# Patient Record
Sex: Male | Born: 2000 | Race: Black or African American | Hispanic: No | Marital: Single | Smoking: Never smoker
Health system: Southern US, Community
[De-identification: ages and names within clinical notes are randomized; demographics above are authoritative.]

## PROBLEM LIST (undated history)

## (undated) ENCOUNTER — Emergency Department (HOSPITAL_COMMUNITY): Admission: EM | Payer: Self-pay | Source: Home / Self Care

---
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
## 2022-01-30 IMAGING — DX DG WRIST COMPLETE 3+V*L*
4 series · 4 of 4 positions shown · non-contrast
Comparison: 12/28/2017

CLINICAL DATA: Fall while playing basketball with wrist pain,
initial encounter

EXAM:
LEFT WRIST - COMPLETE 3+ VIEW

[wrist pa (1 of 3)]
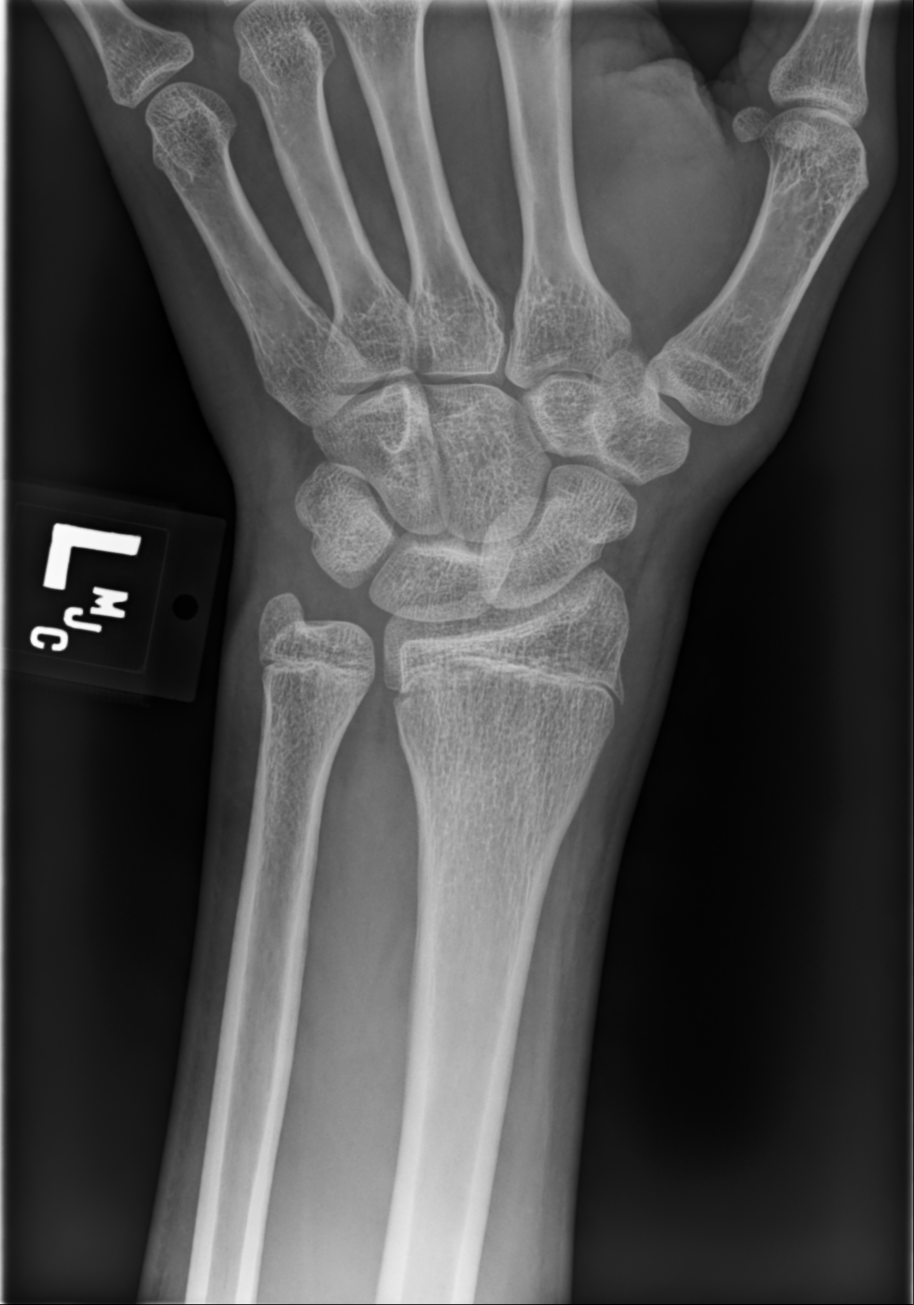

[wrist pa (2 of 3)]
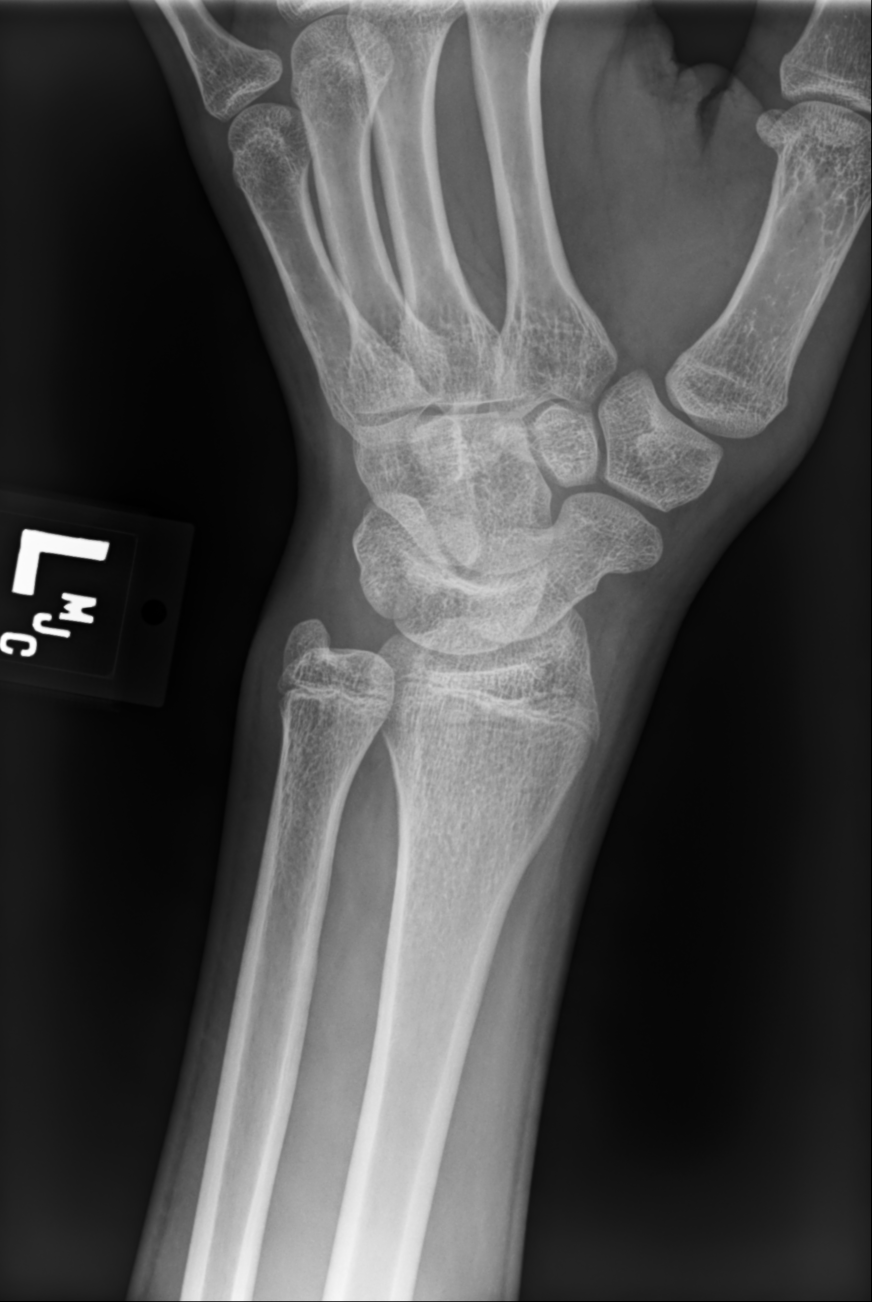

[wrist pa (3 of 3)]
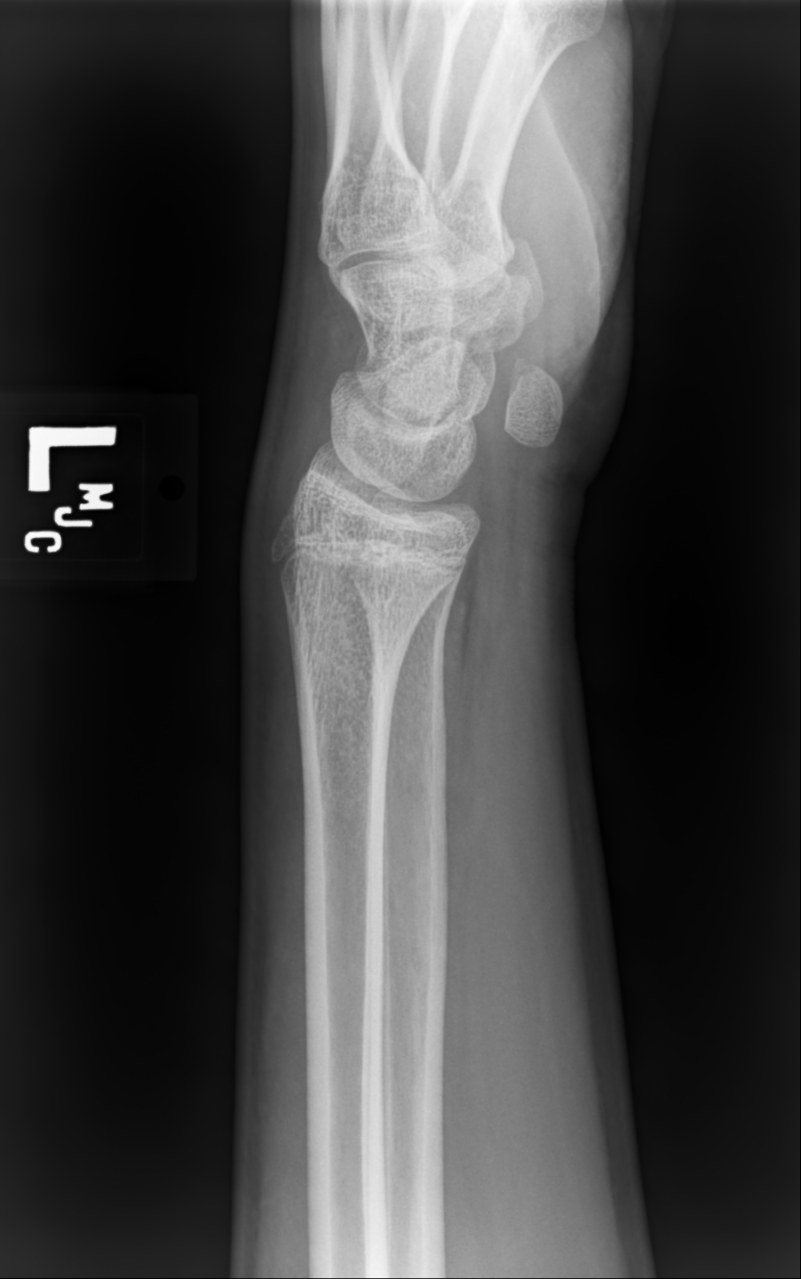

[wrist lat]
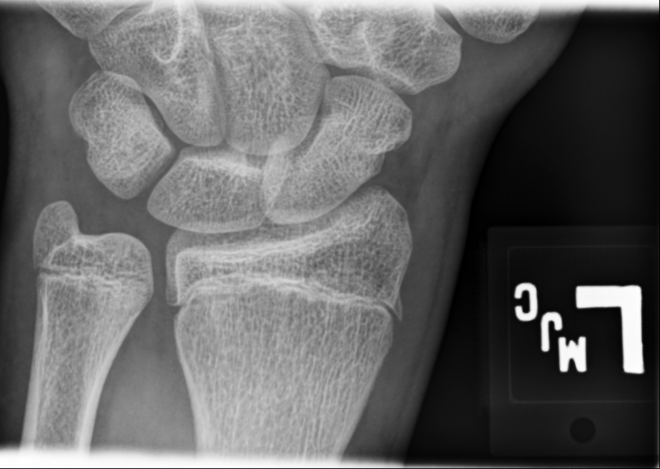

[4 of 4 positions shown; findings below may reference images not displayed]

FINDINGS: Previously seen distal radial fracture has healed in the interval
from the prior exam. No acute fracture or dislocation is noted. No
soft tissue changes are seen.
IMPRESSION: No acute abnormality noted.

## 2022-07-10 ENCOUNTER — Emergency Department (HOSPITAL_COMMUNITY)
Admission: EM | Admit: 2022-07-10 | Discharge: 2022-07-10 | Disposition: A | Discharge status: Home / Self Care | Payer: Medicaid Other | Attending: Emergency Medicine | Admitting: Emergency Medicine

## 2022-07-10 ENCOUNTER — Encounter (HOSPITAL_COMMUNITY): Payer: Self-pay | Admitting: Emergency Medicine

## 2022-07-10 DIAGNOSIS — A64 Unspecified sexually transmitted disease: Secondary | ICD-10-CM | POA: Diagnosis not present

## 2022-07-10 DIAGNOSIS — R369 Urethral discharge, unspecified: Secondary | ICD-10-CM

## 2022-07-10 DIAGNOSIS — R360 Urethral discharge without blood: Secondary | ICD-10-CM | POA: Diagnosis present

## 2022-07-10 DIAGNOSIS — Z711 Person with feared health complaint in whom no diagnosis is made: Secondary | ICD-10-CM

## 2022-07-10 LAB — URINALYSIS, ROUTINE W REFLEX MICROSCOPIC
Bacteria, UA: NONE SEEN
Bilirubin Urine: NEGATIVE
Glucose, UA: NEGATIVE mg/dL
Hgb urine dipstick: NEGATIVE
Ketones, ur: 5 mg/dL — AB
Nitrite: NEGATIVE
Protein, ur: 100 mg/dL — AB
Specific Gravity, Urine: 1.031 — ABNORMAL HIGH (ref 1.005–1.030)
WBC, UA: >50 WBC/hpf (ref 0–5)
pH: 7 (ref 5.0–8.0)

## 2022-07-10 MED ORDER — LIDOCAINE HCL (PF) 1 % IJ SOLN
1.0000 mL | Freq: Once | INTRAMUSCULAR | Status: AC
  Administered 2022-07-10: 2.1 mL
  Filled 2022-07-10: qty 30

## 2022-07-10 MED ORDER — DOXYCYCLINE HYCLATE 100 MG PO TABS: 100.0000 mg | ORAL_TABLET | Freq: Two times a day (BID) | ORAL | 0 refills | Status: AC

## 2022-07-10 MED ORDER — CEFTRIAXONE SODIUM 1 G IJ SOLR
500.0000 mg | Freq: Once | INTRAMUSCULAR | Status: AC
  Administered 2022-07-10: 500 mg via INTRAMUSCULAR
  Filled 2022-07-10: qty 10

## 2022-07-10 NOTE — Discharge Instructions (Addendum)
It was a pleasure taking care of you today!  You have pending lab results for STI work-up.  You may see the results of your labs on MyChart. You were treated today for gonorrhea and chlamydia due to your symptoms and exposure. You will be sent a prescription for Doxycycline, ensure to complete the entire course of antibiotics prescribed. You may follow up with your primary care provider or Health Department if you are experiencing continued symptoms.  If you have future STI concerns you may follow-up with the regional [facility] [phone]).  This location provides testing and treatment for STI and prep services.  This is free for individuals without insurance.  Appointments are required and typically you can get in on the same day.  Ensure to practice safe sex with condom use. Return to the Emergency Department if you are experiencing increasing/worsening symptoms.

## 2022-07-10 NOTE — ED Provider Notes (Signed)
[facility] Provider Note   CSN: [id number] Arrival date & time: 07/10/22  1005     History  Chief Complaint  Patient presents with   Penile Discharge    Aaron Love is a 22 y.o. male who presents emergency department with concerns for yellow/white penile discharge.  Notes recent unprotected intercourse.  Denies his partner stating that they were exposed to STDs.  Patient requesting to be checked for chlamydia at this time.  No meds tried prior to arrival.  Denies abdominal pain, nausea, vomiting, urinary symptoms, penile/testicular pain, or penile swelling/testicular swelling.  The history is provided by the patient. No language interpreter was used.       Home Medications Prior to Admission medications   Medication Sig Start Date End Date Taking? Authorizing Provider  doxycycline (VIBRA-TABS) 100 MG tablet Take 1 tablet (100 mg total) by mouth 2 (two) times daily for 7 days. 07/10/22 07/17/22 Yes Serenah Mill A, PA-C  diphenhydrAMINE (BENADRYL) 25 mg capsule Take 1 capsule (25 mg total) by mouth every 6 (six) hours as needed. Patient not taking: Reported on 12/28/2017 08/26/14   Britt Bottom, NP  permethrin (ELIMITE) 5 % cream After warm bath, apply from neck to bottom of feet. Leave on for into 12 hours and then take another warm soapy water bath to wash it off. Patient not taking: Reported on 12/28/2017 08/26/14   Britt Bottom, NP      Allergies    Patient has no known allergies.    Review of Systems   Review of Systems  Genitourinary:  Positive for penile discharge.  All other systems reviewed and are negative.   Physical Exam Updated Vital Signs BP 121/82 (BP Location: Left Arm)   Pulse 78   Temp 97.9 F (36.6 C) (Oral)   Resp 17   SpO2 99%  Physical Exam Vitals and nursing note reviewed. Exam conducted with a chaperone present.  Constitutional:      General: He is not in acute distress.    Appearance:  Normal appearance. He is not ill-appearing.  HENT:     Head: Normocephalic and atraumatic.     Right Ear: External ear normal.     Left Ear: External ear normal.  Eyes:     General: No scleral icterus. Cardiovascular:     Rate and Rhythm: Normal rate and regular rhythm.     Pulses: Normal pulses.     Heart sounds: Normal heart sounds.  Pulmonary:     Effort: Pulmonary effort is normal.     Breath sounds: Normal breath sounds.  Abdominal:     General: Abdomen is flat. Bowel sounds are normal. There is no distension.     Palpations: Abdomen is soft. There is no mass.     Tenderness: There is no abdominal tenderness.     Hernia: There is no hernia in the left inguinal area or right inguinal area.  Genitourinary:    Penis: Uncircumcised. Discharge present. No erythema, tenderness, swelling or lesions.      Testes: Normal. Cremasteric reflex is present.        Right: Mass, tenderness or swelling not present. Right testis is descended.        Left: Mass, tenderness or swelling not present. Left testis is descended.     Epididymis:     Right: Normal.     Left: Normal.     Comments: NT chaperone present for exam. Uncircumcised penis with yellow penile discharge  noted on exam. No inguinal LAD appreciated. No TTP or swelling noted to penis or testicles.  Musculoskeletal:        General: Normal range of motion.     Cervical back: Normal range of motion and neck supple.  Lymphadenopathy:     Lower Body: No right inguinal adenopathy. No left inguinal adenopathy.  Skin:    General: Skin is warm and dry.     Findings: No rash.  Neurological:     Mental Status: He is alert.     ED Results / Procedures / Treatments   Labs (all labs ordered are listed, but only abnormal results are displayed) Labs Reviewed  URINALYSIS, ROUTINE W REFLEX MICROSCOPIC - Abnormal; Notable for the following components:      Result Value   APPearance HAZY (*)    Specific Gravity, Urine 1.031 (*)    Ketones,  ur 5 (*)    Protein, ur 100 (*)    Leukocytes,Ua LARGE (*)    All other components within normal limits  GC/CHLAMYDIA PROBE AMP ([place]) NOT AT [facility]    EKG None  Radiology No results found.  Procedures Procedures    Medications Ordered in ED Medications  cefTRIAXone (ROCEPHIN) injection 500 mg (has no administration in time range)  lidocaine (PF) (XYLOCAINE) 1 % injection 1-2.1 mL (has no administration in time range)    ED Course/ Medical Decision Making/ A&P                              Medical Decision Making Amount and/or Complexity of Data Reviewed Labs: ordered.  Risk Prescription drug management.   Patient presents to the ED with yellow penile discharge. Recent unprotected intercourse. On exam patient with NT chaperone present for exam. Uncircumcised penis with yellow penile discharge noted on exam. No inguinal LAD appreciated. No TTP or swelling noted to penis or testicles. Otherwise no acute cardiovascular, respiratory, or abdominal exam findings.  Differential diagnosis includes gonorrhea, chlamydia, epididymitis, urethritis.    Labs:  I ordered, and personally interpreted labs.  The pertinent results include:   Urinalysis notable for large amount of leukocytes GC/chlamydia ordered and results pending at time of discharge.  Medications:  I ordered medication including Rocephin for STD prophylaxis  I have reviewed the patients home medicines and have made adjustments as needed  Disposition: Presentation suspicious for penile discharge likely in the setting of recent STD exposure. After consideration of the diagnostic results and the patients response to treatment, I feel that the patient would benefit from Discharge home.  We will send a prescription for doxycycline due to symptoms and exposure.  Patient advised to inform and treat all sexual partners.  Pt advised on safe sex practices and understands that they have GC/Chlamydia cultures pending and will  result in 2-3 days. Patient did not want additional testing at this time. Pt encouraged to follow up at local health department for future STI checks. Supportive care measures and strict return precautions discussed with patient at bedside. Pt acknowledges and verbalizes understanding. Pt appears safe for discharge. Follow up as indicated in discharge paperwork.    This chart was dictated using voice recognition software, Dragon. Despite the best efforts of this provider to proofread and correct errors, errors may still occur which can change documentation meaning.  Final Clinical Impression(s) / ED Diagnoses Final diagnoses:  Penile discharge  Concern about STD in male without diagnosis    Rx /  DC Orders ED Discharge Orders          Ordered    doxycycline (VIBRA-TABS) 100 MG tablet  2 times daily        07/10/22 1124              Mareo Portilla A, PA-C 07/10/22 1125    Fredia Sorrow, MD 07/13/22 1410

## 2022-07-10 NOTE — ED Triage Notes (Signed)
Patient c/o yellow/ white penile discharge. Requesting to be checked for chlamydia.

## 2022-07-11 LAB — GC/CHLAMYDIA PROBE AMP (~~LOC~~) NOT AT ARMC
Chlamydia: POSITIVE — AB
Comment: NEGATIVE
Comment: NORMAL
Neisseria Gonorrhea: POSITIVE — AB

## 2023-05-22 ENCOUNTER — Emergency Department (HOSPITAL_COMMUNITY)
Admission: EM | Admit: 2023-05-22 | Discharge: 2023-05-22 | Disposition: A | Discharge status: Home / Self Care | Payer: Medicaid Other | Attending: Emergency Medicine | Admitting: Emergency Medicine

## 2023-05-22 ENCOUNTER — Telehealth (HOSPITAL_COMMUNITY): Payer: Self-pay | Admitting: Emergency Medicine

## 2023-05-22 ENCOUNTER — Encounter (HOSPITAL_COMMUNITY): Payer: Self-pay | Admitting: Emergency Medicine

## 2023-05-22 ENCOUNTER — Other Ambulatory Visit: Payer: Self-pay

## 2023-05-22 DIAGNOSIS — L988 Other specified disorders of the skin and subcutaneous tissue: Secondary | ICD-10-CM | POA: Insufficient documentation

## 2023-05-22 DIAGNOSIS — Z113 Encounter for screening for infections with a predominantly sexual mode of transmission: Secondary | ICD-10-CM | POA: Insufficient documentation

## 2023-05-22 DIAGNOSIS — R238 Other skin changes: Secondary | ICD-10-CM

## 2023-05-22 DIAGNOSIS — A6001 Herpesviral infection of penis: Secondary | ICD-10-CM

## 2023-05-22 LAB — HIV ANTIBODY (ROUTINE TESTING W REFLEX): HIV Screen 4th Generation wRfx: NONREACTIVE

## 2023-05-22 MED ORDER — VALACYCLOVIR HCL 1 G PO TABS: 1000.0000 mg | ORAL_TABLET | Freq: Three times a day (TID) | ORAL | 0 refills | Status: DC

## 2023-05-22 NOTE — ED Provider Notes (Signed)
[facility] EMERGENCY DEPARTMENT AT [facility] Provider Note   CSN: [id number] Arrival date & time: 05/22/23  7829     History  Chief Complaint  Patient presents with   SEXUALLY TRANSMITTED DISEASE    Aaron Love is a 22 y.o. male.  HPI   22 year old male presenting to the emergency department for STI screening.  The patient only symptom is a few vesicles that have appeared on the glans of his penis.  He is currently sexually active and consents to STI testing.  He denies any dysuria, testicular pain or swelling, penile discharge.  Home Medications Prior to Admission medications   Medication Sig Start Date End Date Taking? Authorizing Provider  valACYclovir (VALTREX) 1000 MG tablet Take 1 tablet (1,000 mg total) by mouth 3 (three) times daily. 05/22/23   Ernie Avena, MD  diphenhydrAMINE (BENADRYL) 25 mg capsule Take 1 capsule (25 mg total) by mouth every 6 (six) hours as needed. Patient not taking: Reported on 12/28/2017 08/26/14   Harle Battiest, NP  permethrin (ELIMITE) 5 % cream After warm bath, apply from neck to bottom of feet. Leave on for into 12 hours and then take another warm soapy water bath to wash it off. Patient not taking: Reported on 12/28/2017 08/26/14   Harle Battiest, NP      Allergies    Patient has no known allergies.    Review of Systems   Review of Systems  All other systems reviewed and are negative.   Physical Exam Updated Vital Signs BP (!) 141/86 (BP Location: Left Arm)   Pulse 79   Temp 99.1 F (37.3 C) (Oral)   Resp 16   Ht 6\' 2"  (1.88 m)   Wt 65.8 kg   SpO2 99%   BMI 18.62 kg/m  Physical Exam Vitals and nursing note reviewed. Exam conducted with a chaperone present.  Constitutional:      General: He is not in acute distress. HENT:     Head: Normocephalic and atraumatic.  Eyes:     Conjunctiva/sclera: Conjunctivae normal.     Pupils: Pupils are equal, round, and reactive to light.  Cardiovascular:     Rate  and Rhythm: Normal rate and regular rhythm.  Pulmonary:     Effort: Pulmonary effort is normal. No respiratory distress.  Abdominal:     General: There is no distension.     Tenderness: There is no guarding.  Genitourinary:    Comments: Vesicular lesions on the right of the glans of the penis concerning for possible genital herpes Musculoskeletal:        General: No deformity or signs of injury.     Cervical back: Neck supple.  Skin:    Findings: No lesion or rash.  Neurological:     General: No focal deficit present.     Mental Status: He is alert. Mental status is at baseline.     ED Results / Procedures / Treatments   Labs (all labs ordered are listed, but only abnormal results are displayed) Labs Reviewed  HIV ANTIBODY (ROUTINE TESTING W REFLEX)  RPR  HSV 1 ANTIBODY, IGG  HSV 2 ANTIBODY, IGG  HSV 1/2 PCR (SURFACE)  GC/CHLAMYDIA PROBE AMP ([place]) NOT AT [facility]    EKG None  Radiology No results found.  Procedures Procedures    Medications Ordered in ED Medications - No data to display  ED Course/ Medical Decision Making/ A&P  Medical Decision Making Amount and/or Complexity of Data Reviewed Labs: ordered.    22 year old male presenting to the emergency department for STI screening.  The patient only symptom is a few vesicles that have appeared on the glans of his penis.  He is currently sexually active and consents to STI testing.  He denies any dysuria, testicular pain or swelling, penile discharge.  On arrival, the patient was vitally stable.  Presenting for STI check, minimal symptoms other than penile lesions.  On exam, the patient has vesicular lesions along the glans of the penis on the right.  Symptoms were ongoing for the last 2 to 3 days.  No other symptoms at this time.  Will not empirically treat.  Lesions are not particularly painful and have no surrounding erythema.  There is no ulceration.  Swabs were sent for  HSV PCR.  Additional empiric STI testing was obtained.  The patient was advised to abstain from sexual intercourse until results of diagnostic testing.  Valtrex was called in to the patient's pharmacy on file in the event that his PCR swab results positive.  Will hold on empiric treatment for gonorrhea and chlamydia at this time pending results of diagnostic testing as the patient is otherwise asymptomatic.  Patient was advised to abstain from sexual intercourse until his results are back.  Advised to follow-up on results on the patient portal.  Stable for discharge.   Final Clinical Impression(s) / ED Diagnoses Final diagnoses:  Vesicular skin lesions  Encounter for screening examination for sexually transmitted disease    Rx / DC Orders ED Discharge Orders     None         Ernie Avena, MD 05/22/23 2057

## 2023-05-22 NOTE — Discharge Instructions (Addendum)
Your vesicular skin lesions on the penis are concerning for possible HSV or genital herpes.  Recommend you abstain from sexual intercourse until you get results from your diagnostic testing.  The vesicle itself was sent for PCR testing to look for HSV DNA.  The remainder of your STI screening will be available on the patient portal

## 2023-05-22 NOTE — ED Triage Notes (Signed)
Patient here by POV for STD check. He states that 2-3 days ago he noticed bumps forming around penis area. He denies burning with urination, odor or pain.

## 2023-05-23 LAB — HSV 1/2 PCR (SURFACE)
HSV-1 DNA: NOT DETECTED
HSV-2 DNA: DETECTED — AB

## 2023-05-23 LAB — GC/CHLAMYDIA PROBE AMP (~~LOC~~) NOT AT ARMC
Chlamydia: NEGATIVE
Comment: NEGATIVE
Comment: NORMAL
Neisseria Gonorrhea: NEGATIVE

## 2023-05-23 LAB — RPR: RPR Ser Ql: NONREACTIVE

## 2023-05-23 LAB — HSV 2 ANTIBODY, IGG: HSV 2 Glycoprotein G Ab, IgG: REACTIVE — AB

## 2023-05-23 LAB — HSV 1 ANTIBODY, IGG: HSV 1 Glycoprotein G Ab, IgG: REACTIVE — AB

## 2023-06-26 ENCOUNTER — Encounter (HOSPITAL_COMMUNITY): Payer: Self-pay

## 2023-06-26 ENCOUNTER — Ambulatory Visit (HOSPITAL_COMMUNITY)
Admission: EM | Admit: 2023-06-26 | Discharge: 2023-06-26 | Disposition: A | Discharge status: Home / Self Care | Payer: Medicaid Other | Attending: Family Medicine | Admitting: Family Medicine

## 2023-06-26 DIAGNOSIS — J111 Influenza due to unidentified influenza virus with other respiratory manifestations: Secondary | ICD-10-CM | POA: Diagnosis not present

## 2023-06-26 MED ORDER — ACETAMINOPHEN 325 MG PO TABS
ORAL_TABLET | ORAL | Status: AC
  Filled 2023-06-26: qty 2

## 2023-06-26 MED ORDER — ACETAMINOPHEN 325 MG PO TABS
650.0000 mg | ORAL_TABLET | Freq: Once | ORAL | Status: AC
  Administered 2023-06-26: 650 mg via ORAL

## 2023-06-26 MED ORDER — OSELTAMIVIR PHOSPHATE 75 MG PO CAPS: 75.0000 mg | ORAL_CAPSULE | Freq: Two times a day (BID) | ORAL | 0 refills | Status: AC

## 2023-06-26 NOTE — ED Provider Notes (Signed)
[facility]    CSN: [id number] Arrival date & time: 06/26/23  1217      History   Chief Complaint Chief Complaint  Patient presents with   Cough   Fever   Chills   Generalized Body Aches    HPI Aaron Love is a 23 y.o. male.    Cough Associated symptoms: fever   Fever Associated symptoms: cough   Here for cough, congestion and subjective fever. He threw up once early yesterday AM, but none since, and no nausea now.  Symptoms began 1/31. He has been having chills. Uncertain if any exposures.  He also has been having a lot of myalgia and malaise  History reviewed. No pertinent past medical history.  There are no active problems to display for this patient.   History reviewed. No pertinent surgical history.     Home Medications    Prior to Admission medications   Medication Sig Start Date End Date Taking? Authorizing Provider  oseltamivir (TAMIFLU) 75 MG capsule Take 1 capsule (75 mg total) by mouth every 12 (twelve) hours. 06/26/23  Yes Zenia Resides, MD    Family History No family history on file.  Social History Social History   Tobacco Use   Smoking status: Never   Smokeless tobacco: Never     Allergies   Patient has no known allergies.   Review of Systems Review of Systems  Constitutional:  Positive for fever.  Respiratory:  Positive for cough.      Physical Exam Triage Vital Signs ED Triage Vitals  Encounter Vitals Group     BP 06/26/23 1511 122/81     Systolic BP Percentile --      Diastolic BP Percentile --      Pulse Rate 06/26/23 1511 79     Resp 06/26/23 1511 16     Temp 06/26/23 1511 100.2 F (37.9 C)     Temp Source 06/26/23 1511 Oral     SpO2 06/26/23 1511 98 %     Weight --      Height --      Head Circumference --      Peak Flow --      Pain Score 06/26/23 1518 7     Pain Loc --      Pain Education --      Exclude from Growth Chart --    No data found.  Updated Vital Signs BP 122/81 (BP  Location: Left Arm)   Pulse 79   Temp 100.2 F (37.9 C) (Oral)   Resp 16   SpO2 98%   Visual Acuity Right Eye Distance:   Left Eye Distance:   Bilateral Distance:    Right Eye Near:   Left Eye Near:    Bilateral Near:     Physical Exam Vitals reviewed.  Constitutional:      General: He is not in acute distress.    Appearance: He is not ill-appearing, toxic-appearing or diaphoretic.  HENT:     Right Ear: Tympanic membrane and ear canal normal.     Left Ear: Tympanic membrane and ear canal normal.     Nose: Congestion present.     Mouth/Throat:     Mouth: Mucous membranes are moist.     Comments: There is erythema of the posterior OP and the tonsillar pillars Eyes:     Extraocular Movements: Extraocular movements intact.     Conjunctiva/sclera: Conjunctivae normal.     Pupils: Pupils are equal, round, and  reactive to light.  Cardiovascular:     Rate and Rhythm: Normal rate and regular rhythm.     Heart sounds: No murmur heard. Pulmonary:     Effort: Pulmonary effort is normal. No respiratory distress.     Breath sounds: Normal breath sounds. No stridor. No wheezing, rhonchi or rales.  Musculoskeletal:     Cervical back: Neck supple.  Lymphadenopathy:     Cervical: No cervical adenopathy.  Skin:    Capillary Refill: Capillary refill takes less than 2 seconds.     Coloration: Skin is not jaundiced or pale.  Neurological:     General: No focal deficit present.     Mental Status: He is alert and oriented to person, place, and time.  Psychiatric:        Behavior: Behavior normal.      UC Treatments / Results  Labs (all labs ordered are listed, but only abnormal results are displayed) Labs Reviewed - No data to display  EKG   Radiology No results found.  Procedures Procedures (including critical care time)  Medications Ordered in UC Medications  acetaminophen (TYLENOL) tablet 650 mg (650 mg Oral Given 06/26/23 1515)    Initial Impression / Assessment and  Plan / UC Course  I have reviewed the triage vital signs and the nursing notes.  Pertinent labs & imaging results that were available during my care of the patient were reviewed by me and considered in my medical decision making (see chart for details).     We have no flu test kits in the building.  He is treated for influenza-like illness with Tamiflu.  Work note provided Final Clinical Impressions(s) / UC Diagnoses   Final diagnoses:  Influenza-like illness     Discharge Instructions      Take oseltamivir 75 mg--1 capsule 2 times daily for 5 days  Take Tylenol as needed for your aches and fever.  Make sure you are drinking enough fluids     ED Prescriptions     Medication Sig Dispense Auth. Provider   oseltamivir (TAMIFLU) 75 MG capsule Take 1 capsule (75 mg total) by mouth every 12 (twelve) hours. 10 capsule Zenia Resides, MD      PDMP not reviewed this encounter.   Zenia Resides, MD 06/26/23 [phone]

## 2023-06-26 NOTE — ED Triage Notes (Signed)
Cough,body aches,chills x 3 days. Patient has taken Mucinex with no relief.

## 2023-06-26 NOTE — Discharge Instructions (Signed)
Take oseltamivir 75 mg--1 capsule 2 times daily for 5 days  Take Tylenol as needed for your aches and fever.  Make sure you are drinking enough fluids
